# Patient Record
Sex: Male | Born: 1975 | Race: White | Hispanic: No | Smoking: Current every day smoker
Health system: Southern US, Community
[De-identification: ages and names within clinical notes are randomized; demographics above are authoritative.]

## PROBLEM LIST (undated history)

## (undated) DIAGNOSIS — F419 Anxiety disorder, unspecified: Secondary | ICD-10-CM

## (undated) DIAGNOSIS — I10 Essential (primary) hypertension: Secondary | ICD-10-CM

## (undated) DIAGNOSIS — K219 Gastro-esophageal reflux disease without esophagitis: Secondary | ICD-10-CM

## (undated) HISTORY — PX: MASTECTOMY: SHX3

## (undated) HISTORY — PX: HERNIA REPAIR: SHX51

## (undated) HISTORY — DX: Anxiety disorder, unspecified: F41.9

## (undated) HISTORY — DX: Gastro-esophageal reflux disease without esophagitis: K21.9

## (undated) HISTORY — DX: Essential (primary) hypertension: I10

---

## 2009-01-26 ENCOUNTER — Encounter: Admission: RE | Admit: 2009-01-26 | Discharge: 2009-01-26 | Payer: Self-pay | Admitting: Neurosurgery

## 2009-05-18 ENCOUNTER — Encounter: Admission: RE | Admit: 2009-05-18 | Discharge: 2009-05-18 | Payer: Self-pay | Admitting: Neurosurgery

## 2010-06-06 ENCOUNTER — Encounter: Payer: Self-pay | Admitting: Neurosurgery

## 2017-07-25 DIAGNOSIS — Z1389 Encounter for screening for other disorder: Secondary | ICD-10-CM | POA: Diagnosis not present

## 2017-07-25 DIAGNOSIS — M2550 Pain in unspecified joint: Secondary | ICD-10-CM | POA: Diagnosis not present

## 2017-07-25 DIAGNOSIS — R739 Hyperglycemia, unspecified: Secondary | ICD-10-CM | POA: Diagnosis not present

## 2017-07-25 DIAGNOSIS — I10 Essential (primary) hypertension: Secondary | ICD-10-CM | POA: Diagnosis not present

## 2017-07-25 DIAGNOSIS — I83813 Varicose veins of bilateral lower extremities with pain: Secondary | ICD-10-CM | POA: Diagnosis not present

## 2018-03-25 DIAGNOSIS — Z6839 Body mass index (BMI) 39.0-39.9, adult: Secondary | ICD-10-CM | POA: Diagnosis not present

## 2018-03-25 DIAGNOSIS — R739 Hyperglycemia, unspecified: Secondary | ICD-10-CM | POA: Diagnosis not present

## 2018-03-25 DIAGNOSIS — M79644 Pain in right finger(s): Secondary | ICD-10-CM | POA: Diagnosis not present

## 2018-03-25 DIAGNOSIS — Z23 Encounter for immunization: Secondary | ICD-10-CM | POA: Diagnosis not present

## 2018-03-25 DIAGNOSIS — I10 Essential (primary) hypertension: Secondary | ICD-10-CM | POA: Diagnosis not present

## 2018-10-25 ENCOUNTER — Other Ambulatory Visit: Payer: Self-pay

## 2018-10-25 DIAGNOSIS — Z20822 Contact with and (suspected) exposure to covid-19: Secondary | ICD-10-CM

## 2018-10-29 LAB — NOVEL CORONAVIRUS, NAA: SARS-CoV-2, NAA: NOT DETECTED

## 2019-01-31 DIAGNOSIS — Z6838 Body mass index (BMI) 38.0-38.9, adult: Secondary | ICD-10-CM | POA: Diagnosis not present

## 2019-01-31 DIAGNOSIS — M545 Low back pain: Secondary | ICD-10-CM | POA: Diagnosis not present

## 2019-03-04 ENCOUNTER — Other Ambulatory Visit: Payer: Self-pay

## 2019-03-04 DIAGNOSIS — Z20822 Contact with and (suspected) exposure to covid-19: Secondary | ICD-10-CM

## 2019-03-05 LAB — NOVEL CORONAVIRUS, NAA: SARS-CoV-2, NAA: NOT DETECTED

## 2019-03-07 ENCOUNTER — Telehealth: Payer: Self-pay

## 2019-03-07 NOTE — Telephone Encounter (Signed)
Patient called in and received his covid test result °

## 2019-04-28 DIAGNOSIS — Z6838 Body mass index (BMI) 38.0-38.9, adult: Secondary | ICD-10-CM | POA: Diagnosis not present

## 2019-04-28 DIAGNOSIS — R197 Diarrhea, unspecified: Secondary | ICD-10-CM | POA: Diagnosis not present

## 2019-04-28 DIAGNOSIS — K529 Noninfective gastroenteritis and colitis, unspecified: Secondary | ICD-10-CM | POA: Diagnosis not present

## 2019-11-25 ENCOUNTER — Ambulatory Visit: Payer: Medicaid Other

## 2019-11-25 ENCOUNTER — Other Ambulatory Visit: Payer: Self-pay

## 2019-12-01 ENCOUNTER — Ambulatory Visit: Payer: Medicaid Other

## 2020-01-22 ENCOUNTER — Ambulatory Visit: Payer: Medicaid Other | Admitting: Urology

## 2021-03-08 ENCOUNTER — Encounter (INDEPENDENT_AMBULATORY_CARE_PROVIDER_SITE_OTHER): Payer: Self-pay | Admitting: *Deleted

## 2021-07-14 ENCOUNTER — Ambulatory Visit (INDEPENDENT_AMBULATORY_CARE_PROVIDER_SITE_OTHER): Payer: Medicaid Other | Admitting: Gastroenterology

## 2021-07-14 ENCOUNTER — Encounter (INDEPENDENT_AMBULATORY_CARE_PROVIDER_SITE_OTHER): Payer: Self-pay | Admitting: Gastroenterology

## 2021-07-18 ENCOUNTER — Encounter (INDEPENDENT_AMBULATORY_CARE_PROVIDER_SITE_OTHER): Payer: Self-pay | Admitting: *Deleted

## 2021-09-12 ENCOUNTER — Encounter (INDEPENDENT_AMBULATORY_CARE_PROVIDER_SITE_OTHER): Payer: Self-pay | Admitting: *Deleted

## 2021-11-10 ENCOUNTER — Encounter (INDEPENDENT_AMBULATORY_CARE_PROVIDER_SITE_OTHER): Payer: Self-pay

## 2021-11-10 ENCOUNTER — Telehealth (INDEPENDENT_AMBULATORY_CARE_PROVIDER_SITE_OTHER): Payer: Self-pay

## 2021-11-10 ENCOUNTER — Encounter (INDEPENDENT_AMBULATORY_CARE_PROVIDER_SITE_OTHER): Payer: Self-pay | Admitting: Gastroenterology

## 2021-11-10 ENCOUNTER — Ambulatory Visit (INDEPENDENT_AMBULATORY_CARE_PROVIDER_SITE_OTHER): Payer: BC Managed Care – PPO | Admitting: Gastroenterology

## 2021-11-10 DIAGNOSIS — R1012 Left upper quadrant pain: Secondary | ICD-10-CM | POA: Diagnosis not present

## 2021-11-10 DIAGNOSIS — K219 Gastro-esophageal reflux disease without esophagitis: Secondary | ICD-10-CM | POA: Diagnosis not present

## 2021-11-10 MED ORDER — PEG 3350-KCL-NA BICARB-NACL 420 G PO SOLR: 4000.0000 mL | ORAL | 0 refills | Status: AC

## 2021-11-10 NOTE — Patient Instructions (Signed)
Schedule EGD and colonoscopy Continue pantoprazole 40 mg qday Patient encouraged to lose weight as this may provide benefit for her specific complaint but also due to other comorbidities Explained presumed etiology of reflux symptoms. Instruction provided in the use of antireflux medication - patient should take medication in the morning 30-45 minutes before eating breakfast. Discussed avoidance of eating within 2 hours of lying down to sleep and benefit of blocks to elevate head of bed. Also, will benefit from avoiding carbonated drinks/sodas or food that has tomatoes, spicy or greasy food.

## 2021-11-10 NOTE — Telephone Encounter (Signed)
Kynleigh Artz Ann Destine Ambroise, CMA  ?

## 2021-11-10 NOTE — Progress Notes (Signed)
Maylon Peppers, M.D. Gastroenterology & Hepatology [facility] [address] [place], [place] [address] Primary Care Physician: Denny Levy, [place] [address]  Referring MD: PCP  Chief Complaint: Left upper quadrant abdominal pain and GERD  History of Present Illness: Jeffrey Pitts is a 46 y.o. male with PM c. Diff colitis, GERD, anxiety, HTN, who presents for evaluation of left upper quadrant abdominal pain and GERD.  Patient reports that for the last 9 years he has presented recurrent episodes of LUQ constant pain, which exacerbates when eating roughage and greasy foods. He describes the pain as a spasm in his LUQ. It does not radiate anywhere else. He was put on pantoprazole 40 mg since his symptoms started for management of pain control, which he takes in the AM when he wakes up as he works third shift. He did not have heartburn episodes in the past, but he noticed recently that if he does not take it there will be presence of heartburn and regurgitation.  The patient denies having any nausea, vomiting, fever, chills, hematochezia, melena, hematemesis, abdominal distention, diarrhea, jaundice, pruritus.  Has tried changing his diet recently but is still not able to lose weight.  Has regular bowel movements.  Last NID:POEUM Last Colonoscopy:never  FHx: neg for any gastrointestinal/liver disease, no malignancies Social: smokes 1/2 pack a day, neg frequent alcohol , smokes marijuana occasionally Surgical: inguinal hernia  Past Medical History: Past Medical History:  Diagnosis Date   Anxiety    GERD (gastroesophageal reflux disease)    HTN (hypertension)     Past Surgical History: Past Surgical History:  Procedure Laterality Date   HERNIA REPAIR     MASTECTOMY Left     Family History: Family History  Problem Relation Age of Onset   Healthy Mother    Chronic Renal Failure Father     Social  History: Social History   Tobacco Use  Smoking Status Every Day   Packs/day: 0.50   Types: Cigarettes  Smokeless Tobacco Never   Social History   Substance and Sexual Activity  Alcohol Use Yes   Comment: 6-8 beers every other weekend.   Social History   Substance and Sexual Activity  Drug Use Yes   Types: Marijuana    Allergies: No Known Allergies  Medications: Current Outpatient Medications  Medication Sig Dispense Refill   pantoprazole (PROTONIX) 40 MG tablet Take 40 mg by mouth daily.     No current facility-administered medications for this visit.    Review of Systems: GENERAL: negative for malaise, night sweats HEENT: No changes in hearing or vision, no nose bleeds or other nasal problems. NECK: Negative for lumps, goiter, pain and significant neck swelling RESPIRATORY: Negative for cough, wheezing CARDIOVASCULAR: Negative for chest pain, leg swelling, palpitations, orthopnea GI: SEE HPI MUSCULOSKELETAL: Negative for joint pain or swelling, back pain, and muscle pain. SKIN: Negative for lesions, rash PSYCH: Negative for sleep disturbance, mood disorder and recent psychosocial stressors. HEMATOLOGY Negative for prolonged bleeding, bruising easily, and swollen nodes. ENDOCRINE: Negative for cold or heat intolerance, polyuria, polydipsia and goiter. NEURO: negative for tremor, gait imbalance, syncope and seizures. The remainder of the review of systems is noncontributory.   Physical Exam: BP (!) 156/117 (BP Location: Left Arm, Patient Position: Sitting, Cuff Size: Large)   Pulse (!) 109   Temp 98.1 F (36.7 C) (Oral)   Ht '5\' 10"'$  (1.778 m)   Wt 282 lb 9.6 oz (128.2 kg)  BMI 40.55 kg/m  GENERAL: The patient is AO x3, in no acute distress. HEENT: Head is normocephalic and atraumatic. EOMI are intact. Mouth is well hydrated and without lesions. NECK: Supple. No masses LUNGS: Clear to auscultation. No presence of rhonchi/wheezing/rales. Adequate chest  expansion HEART: RRR, normal s1 and s2. ABDOMEN: Soft, nontender, no guarding, no peritoneal signs, and nondistended. BS +. No masses. EXTREMITIES: Without any cyanosis, clubbing, rash, lesions or edema. NEUROLOGIC: AOx3, no focal motor deficit. SKIN: no jaundice, no rashes   Imaging/Labs: as above  I personally reviewed and interpreted the available labs, imaging and endoscopic files.  Impression and Plan: Jeffrey Pitts is a 46 y.o. male with PM c. Diff colitis, GERD, anxiety, HTN, who presents for evaluation of left upper quadrant abdominal pain and GERD.  The patient has presented a chronic history of left upper quadrant abdominal pain of unclear etiology.  He has not presented any associated symptoms and reports his pain is improved with the use of PPI although it is not completely gone.  At this moment, we will recommend scheduling an EGD to rule out organic etiologies given the persistence of pain despite taking PPI for more than 6 weeks.  It seems that he has presented improvement of his GERD while taking pantoprazole 40 mg every day.  He can continue taking this medication on a daily basis and if his symptoms are completely suppressed in a year, we can consider decreasing the dosage to 20 mg daily. The patient and I held a thorough discussion about potential nonpharmacologic treatments for reflux which include Transoral Incisionless Fundoplication (TIF).  The benefits and risks, as well as prognosis with the use of the different modalities was thoroughly discussed with the patient who understood and agreed.  The patient will read more about these procedures and will let me know if interested to pursue this in the future.  Finally, the patient is due for CRC screening, we will schedule a colonoscopy  - Schedule EGD and colonoscopy - Continue pantoprazole 40 mg qday - Patient encouraged to lose weight as this may provide benefit for her specific complaint but also due to other  comorbidities - Explained presumed etiology of reflux symptoms. Instruction provided in the use of antireflux medication - patient should take medication in the morning 30-45 minutes before eating breakfast. Discussed avoidance of eating within 2 hours of lying down to sleep and benefit of blocks to elevate head of bed. Also, will benefit from avoiding carbonated drinks/sodas or food that has tomatoes, spicy or greasy food.  All questions were answered.      Maylon Peppers, MD Gastroenterology and Hepatology [facility]

## 2021-12-23 ENCOUNTER — Ambulatory Visit (HOSPITAL_COMMUNITY): Payer: BC Managed Care – PPO | Admitting: Anesthesiology

## 2021-12-23 ENCOUNTER — Other Ambulatory Visit: Payer: Self-pay

## 2021-12-23 ENCOUNTER — Encounter (HOSPITAL_COMMUNITY): Payer: Self-pay | Admitting: Gastroenterology

## 2021-12-23 ENCOUNTER — Encounter (HOSPITAL_COMMUNITY)
Admission: RE | Disposition: A | Discharge status: Home / Self Care | Payer: Self-pay | Source: Home / Self Care | Attending: Gastroenterology

## 2021-12-23 ENCOUNTER — Ambulatory Visit (HOSPITAL_COMMUNITY)
Admission: RE | Admit: 2021-12-23 | Discharge: 2021-12-23 | Disposition: A | Discharge status: Home / Self Care | Payer: BC Managed Care – PPO | Attending: Gastroenterology | Admitting: Gastroenterology

## 2021-12-23 DIAGNOSIS — D122 Benign neoplasm of ascending colon: Secondary | ICD-10-CM | POA: Diagnosis not present

## 2021-12-23 DIAGNOSIS — F17210 Nicotine dependence, cigarettes, uncomplicated: Secondary | ICD-10-CM | POA: Insufficient documentation

## 2021-12-23 DIAGNOSIS — F419 Anxiety disorder, unspecified: Secondary | ICD-10-CM | POA: Diagnosis not present

## 2021-12-23 DIAGNOSIS — K648 Other hemorrhoids: Secondary | ICD-10-CM | POA: Insufficient documentation

## 2021-12-23 DIAGNOSIS — K31A19 Gastric intestinal metaplasia without dysplasia, unspecified site: Secondary | ICD-10-CM | POA: Insufficient documentation

## 2021-12-23 DIAGNOSIS — K319 Disease of stomach and duodenum, unspecified: Secondary | ICD-10-CM | POA: Insufficient documentation

## 2021-12-23 DIAGNOSIS — K5730 Diverticulosis of large intestine without perforation or abscess without bleeding: Secondary | ICD-10-CM | POA: Diagnosis not present

## 2021-12-23 DIAGNOSIS — D123 Benign neoplasm of transverse colon: Secondary | ICD-10-CM | POA: Diagnosis not present

## 2021-12-23 DIAGNOSIS — Z1211 Encounter for screening for malignant neoplasm of colon: Secondary | ICD-10-CM | POA: Insufficient documentation

## 2021-12-23 DIAGNOSIS — K219 Gastro-esophageal reflux disease without esophagitis: Secondary | ICD-10-CM | POA: Insufficient documentation

## 2021-12-23 DIAGNOSIS — R1012 Left upper quadrant pain: Secondary | ICD-10-CM | POA: Diagnosis not present

## 2021-12-23 DIAGNOSIS — K635 Polyp of colon: Secondary | ICD-10-CM | POA: Diagnosis not present

## 2021-12-23 HISTORY — PX: ESOPHAGOGASTRODUODENOSCOPY (EGD) WITH PROPOFOL: SHX5813

## 2021-12-23 HISTORY — PX: COLONOSCOPY WITH PROPOFOL: SHX5780

## 2021-12-23 HISTORY — PX: BIOPSY: SHX5522

## 2021-12-23 HISTORY — PX: POLYPECTOMY: SHX5525

## 2021-12-23 LAB — HM COLONOSCOPY

## 2021-12-23 SURGERY — COLONOSCOPY WITH PROPOFOL
Anesthesia: General

## 2021-12-23 MED ORDER — LACTATED RINGERS IV SOLN: INTRAVENOUS | Status: DC

## 2021-12-23 MED ORDER — STERILE WATER FOR IRRIGATION IR SOLN
Status: DC | PRN
  Administered 2021-12-23: .6 mL

## 2021-12-23 MED ORDER — PROPOFOL 10 MG/ML IV BOLUS
INTRAVENOUS | Status: DC | PRN
  Administered 2021-12-23 (×3): 50 mg via INTRAVENOUS
  Administered 2021-12-23: 100 mg via INTRAVENOUS
  Administered 2021-12-23 (×2): 50 mg via INTRAVENOUS
  Administered 2021-12-23: 100 mg via INTRAVENOUS
  Administered 2021-12-23 (×2): 50 mg via INTRAVENOUS

## 2021-12-23 NOTE — H&P (Signed)
Jeffrey Pitts is an 46 y.o. male.   Chief Complaint: abdominal pain, GERD and CRC screening HPI: Jeffrey Pitts is a 46 y.o. male with PM c. Diff colitis, GERD, anxiety, HTN, who presents for colorectal cancer screening, left upper quadrant abdominal pain and GERD.    Patient reported as long as he takes pantoprazole 40 mg every day he does not have any episodes of GERD.  However, he still presenting left upper quadrant abdominal pain intermittently which does not improve with the medication. The patient has never had a colonoscopy in the past.  The patient denies having any complaints such as melena, hematochezia, abdominal distention, change in her bowel movement consistency or frequency, no changes in weight recently.  No family history of colorectal cancer.   Past Medical History:  Diagnosis Date   Anxiety    GERD (gastroesophageal reflux disease)    HTN (hypertension)     Past Surgical History:  Procedure Laterality Date   HERNIA REPAIR     MASTECTOMY Left     Family History  Problem Relation Age of Onset   Healthy Mother    Chronic Renal Failure Father    Social History:  reports that he has been smoking cigarettes. He has been smoking an average of .5 packs per day. He has never used smokeless tobacco. He reports current alcohol use. He reports current drug use. Drug: Marijuana.  Allergies: No Known Allergies  Medications Prior to Admission  Medication Sig Dispense Refill   Multiple Vitamin (MULTIVITAMIN WITH MINERALS) TABS tablet Take 1 tablet by mouth once a week.     pantoprazole (PROTONIX) 40 MG tablet Take 40 mg by mouth in the morning.     polyethylene glycol-electrolytes (TRILYTE) 420 g solution Take 4,000 mLs by mouth as directed. 4000 mL 0   Ascorbic Acid (VITAMIN C PO) Take 1 tablet by mouth once a week.     busPIRone (BUSPAR) 15 MG tablet Take 15 mg by mouth daily.      No results found for this or any previous visit (from the past 48 hour(s)). No results  found.  Review of Systems  Constitutional: Negative.   HENT: Negative.    Eyes: Negative.   Respiratory: Negative.    Cardiovascular: Negative.   Gastrointestinal:  Positive for abdominal pain.  Endocrine: Negative.   Genitourinary: Negative.   Musculoskeletal: Negative.   Skin: Negative.   Allergic/Immunologic: Negative.   Neurological: Negative.   Hematological: Negative.   Psychiatric/Behavioral: Negative.      Blood pressure (!) 177/104, pulse 90, temperature 98 F (36.7 C), temperature source Oral, resp. rate 16, height '5\' 10"'$  (1.778 m), weight 127.9 kg, SpO2 98 %. Physical Exam  GENERAL: The patient is AO x3, in no acute distress. HEENT: Head is normocephalic and atraumatic. EOMI are intact. Mouth is well hydrated and without lesions. NECK: Supple. No masses LUNGS: Clear to auscultation. No presence of rhonchi/wheezing/rales. Adequate chest expansion HEART: RRR, normal s1 and s2. ABDOMEN: Soft, nontender, no guarding, no peritoneal signs, and nondistended. BS +. No masses. EXTREMITIES: Without any cyanosis, clubbing, rash, lesions or edema. NEUROLOGIC: AOx3, no focal motor deficit. SKIN: no jaundice, no rashes  Assessment/Plan Jeffrey Pitts is a 46 y.o. male with PM c. Diff colitis, GERD, anxiety, HTN, who presents for colorectal cancer screening, left upper quadrant abdominal pain and GERD.  Will proceed with EGD and colonoscopy.  Harvel Quale, MD 12/23/2021, 8:27 AM

## 2021-12-23 NOTE — Op Note (Signed)
[facility] Patient Name: Jeffrey Pitts Procedure Date: 12/23/2021 9:36 AM MRN: [id number] Date of Birth: 05-Nov-1975 Attending MD: Maylon Peppers ,  CSN: [id number] Age: 46 Admit Type: Outpatient Procedure:                Colonoscopy Indications:              Screening for colorectal malignant neoplasm Providers:                Maylon Peppers, Janeece Riggers, RN, Rosina Lowenstein, RN Referring MD:             Maylon Peppers Medicines:                Monitored Anesthesia Care Complications:            No immediate complications. Estimated Blood Loss:     Estimated blood loss: none. Procedure:                Pre-Anesthesia Assessment:                           - Prior to the procedure, a History and Physical                            was performed, and patient medications, allergies                            and sensitivities were reviewed. The patient's                            tolerance of previous anesthesia was reviewed.                           - The risks and benefits of the procedure and the                            sedation options and risks were discussed with the                            patient. All questions were answered and informed                            consent was obtained.                           - ASA Grade Assessment: II - A patient with mild                            systemic disease.                           After obtaining informed consent, the colonoscope                            was passed under direct vision. Throughout the                            procedure, the patient's  blood pressure, pulse, and                            oxygen saturations were monitored continuously. The                            [id number]-HQ190L ([id number]) scope was introduced through                            the anus and advanced to the the cecum, identified                            by appendiceal orifice and ileocecal valve. The                             colonoscopy was performed without difficulty. The                            patient tolerated the procedure well. The quality                            of the bowel preparation was good. Scope In: 4:16:60 AM Scope Out: 10:17:33 AM Scope Withdrawal Time: 0 hours 16 minutes 8 seconds  Total Procedure Duration: 0 hours 21 minutes 19 seconds  Findings:      The perianal and digital rectal examinations were normal.      Two sessile polyps were found in the transverse colon and ascending       colon. The polyps were 4 to 8 mm in size. These polyps were removed with       a cold snare. Resection and retrieval were complete.      A 8 mm polyp was found in the sigmoid colon. The polyp was sessile. The       polyp was removed with a cold snare. Resection and retrieval were       complete.      A few small and large-mouthed diverticula were found in the sigmoid       colon and ascending colon.      Non-bleeding internal hemorrhoids were found during retroflexion. The       hemorrhoids were small. Impression:               - Two 4 to 8 mm polyps in the transverse colon and                            in the ascending colon, removed with a cold snare.                            Resected and retrieved.                           - One 8 mm polyp in the sigmoid colon, removed with                            a cold snare. Resected and retrieved.                           -  Diverticulosis in the sigmoid colon and in the                            ascending colon.                           - Non-bleeding internal hemorrhoids. Moderate Sedation:      Per Anesthesia Care Recommendation:           - Discharge patient to home (ambulatory).                           - Resume previous diet.                           - Await pathology results.                           - Repeat colonoscopy for surveillance based on                            pathology results. Procedure Code(s):        --- Professional  ---                           [phone], Colonoscopy, flexible; with removal of                            tumor(s), polyp(s), or other lesion(s) by snare                            technique Diagnosis Code(s):        --- Professional ---                           K63.5, Polyp of colon                           Z12.11, Encounter for screening for malignant                            neoplasm of colon                           K64.8, Other hemorrhoids                           K57.30, Diverticulosis of large intestine without                            perforation or abscess without bleeding CPT copyright 2019 American Medical Association. All rights reserved. The codes documented in this report are preliminary and upon coder review may  be revised to meet current compliance requirements. Maylon Peppers, MD Maylon Peppers,  12/23/2021 10:24:00 AM This report has been signed electronically. Number of Addenda: 0

## 2021-12-23 NOTE — Anesthesia Preprocedure Evaluation (Addendum)
Anesthesia Evaluation  Patient identified by MRN, date of birth, ID band Patient awake    Reviewed: Allergy & Precautions, NPO status , Patient's Chart, lab work & pertinent test results  Airway Mallampati: II  TM Distance: >3 FB Neck ROM: Full    Dental  (+) Dental Advisory Given, Missing, Chipped,    Pulmonary neg sleep apnea (snoring), Current Smoker and Patient abstained from smoking.,    Pulmonary exam normal breath sounds clear to auscultation       Cardiovascular Exercise Tolerance: Good hypertension, Normal cardiovascular exam Rhythm:Regular Rate:Normal     Neuro/Psych PSYCHIATRIC DISORDERS Anxiety negative neurological ROS     GI/Hepatic GERD  Medicated,(+)     substance abuse  marijuana use,   Endo/Other  negative endocrine ROS  Renal/GU negative Renal ROS  negative genitourinary   Musculoskeletal negative musculoskeletal ROS (+)   Abdominal   Peds negative pediatric ROS (+)  Hematology negative hematology ROS (+)   Anesthesia Other Findings Back pain  Reproductive/Obstetrics negative OB ROS                           Anesthesia Physical Anesthesia Plan  ASA: 3  Anesthesia Plan: General   Post-op Pain Management: Minimal or no pain anticipated   Induction: Intravenous  PONV Risk Score and Plan: Propofol infusion  Airway Management Planned: Nasal Cannula and Natural Airway  Additional Equipment:   Intra-op Plan:   Post-operative Plan:   Informed Consent: I have reviewed the patients History and Physical, chart, labs and discussed the procedure including the risks, benefits and alternatives for the proposed anesthesia with the patient or authorized representative who has indicated his/her understanding and acceptance.     Dental advisory given  Plan Discussed with: CRNA and Surgeon  Anesthesia Plan Comments:       Anesthesia Quick Evaluation

## 2021-12-23 NOTE — Anesthesia Postprocedure Evaluation (Signed)
Anesthesia Post Note  Patient: Jeffrey Pitts  Procedure(s) Performed: COLONOSCOPY WITH PROPOFOL ESOPHAGOGASTRODUODENOSCOPY (EGD) WITH PROPOFOL BIOPSY POLYPECTOMY  Patient location during evaluation: Endoscopy Anesthesia Type: General Level of consciousness: awake and alert Pain management: pain level controlled Vital Signs Assessment: post-procedure vital signs reviewed and stable Respiratory status: spontaneous breathing Cardiovascular status: blood pressure returned to baseline and stable Postop Assessment: no apparent nausea or vomiting Anesthetic complications: no   No notable events documented.   Last Vitals:  Vitals:   12/23/21 0822 12/23/21 1022  BP: (!) 177/104 123/80  Pulse:  92  Resp:  18  Temp:  36.4 C  SpO2:  100%    Last Pain:  Vitals:   12/23/21 1022  TempSrc: Oral  PainSc: 0-No pain                 Sharee Pimple

## 2021-12-23 NOTE — Op Note (Addendum)
[facility] Patient Name: Jeffrey Pitts Procedure Date: 12/23/2021 9:37 AM MRN: [id number] Date of Birth: Sep 11, 1975 Attending MD: Maylon Peppers ,  CSN: [id number] Age: 46 Admit Type: Outpatient Procedure:                Upper GI endoscopy Indications:              Abdominal pain in the left upper quadrant,                            Follow-up of gastro-esophageal reflux disease Providers:                Maylon Peppers, Janeece Riggers, RN, Rosina Lowenstein, RN Referring MD:             Maylon Peppers Medicines:                Monitored Anesthesia Care Complications:            No immediate complications. Estimated Blood Loss:     Estimated blood loss: none. Procedure:                Pre-Anesthesia Assessment:                           - Prior to the procedure, a History and Physical                            was performed, and patient medications, allergies                            and sensitivities were reviewed. The patient's                            tolerance of previous anesthesia was reviewed.                           - The risks and benefits of the procedure and the                            sedation options and risks were discussed with the                            patient. All questions were answered and informed                            consent was obtained.                           - ASA Grade Assessment: II - A patient with mild                            systemic disease.                           After obtaining informed consent, the endoscope was  passed under direct vision. Throughout the                            procedure, the patient's blood pressure, pulse, and                            oxygen saturations were monitored continuously. The                            [id number]-[id number] ([id number]) scope was introduced through the                            mouth, and advanced to the second part of duodenum.                            The  upper GI endoscopy was accomplished without                            difficulty. The patient tolerated the procedure                            well. Scope In: 9:45:44 AM Scope Out: 9:50:25 AM Total Procedure Duration: 0 hours 4 minutes 41 seconds  Findings:      The examined esophagus was normal.      The Z-line was regular and was found 40 cm from the incisors.      The gastroesophageal flap valve was visualized endoscopically and       classified as Hill Grade III (minimal fold, loose to endoscope, hiatal       hernia likely).      The entire examined stomach was normal. Biopsies were taken with a cold       forceps for Helicobacter pylori testing.      The examined duodenum was normal.      Note: pain possibly related to functional dyspepsia Impression:               - Normal esophagus.                           - Z-line regular, 40 cm from the incisors.                           - Gastroesophageal flap valve classified as Hill                            Grade III (minimal fold, loose to endoscope, hiatal                            hernia likely).                           - Normal stomach. Biopsied.                           - Normal examined duodenum. Moderate Sedation:      Per Anesthesia Care Recommendation:           -  Discharge patient to home (ambulatory).                           - Resume previous diet.                           - Continue present medications.                           - Await pathology results.                           - Check H. pylori serology Procedure Code(s):        --- Professional ---                           [phone], Esophagogastroduodenoscopy, flexible,                            transoral; with biopsy, single or multiple Diagnosis Code(s):        --- Professional ---                           R10.12, Left upper quadrant pain                           K21.9, Gastro-esophageal reflux disease without                             esophagitis CPT copyright 2019 American Medical Association. All rights reserved. The codes documented in this report are preliminary and upon coder review may  be revised to meet current compliance requirements. Maylon Peppers, MD Maylon Peppers,  12/23/2021 9:55:04 AM This report has been signed electronically. Number of Addenda: 0

## 2021-12-23 NOTE — Transfer of Care (Signed)
Immediate Anesthesia Transfer of Care Note  Patient: Jeffrey Pitts  Procedure(s) Performed: COLONOSCOPY WITH PROPOFOL ESOPHAGOGASTRODUODENOSCOPY (EGD) WITH PROPOFOL BIOPSY POLYPECTOMY  Patient Location: Endoscopy Unit  Anesthesia Type:General  Level of Consciousness: awake  Airway & Oxygen Therapy: Patient Spontanous Breathing  Post-op Assessment: Report given to RN  Post vital signs: Reviewed and stable  Last Vitals:  Vitals Value Taken Time  BP                      123/80    Temp 36.4 C 12/23/21 1022  Pulse 92 12/23/21 1022  Resp 18 12/23/21 1022  SpO2 100 % 12/23/21 1022    Last Pain:  Vitals:   12/23/21 1022  TempSrc: Oral  PainSc: 0-No pain      Patients Stated Pain Goal: 8 (81/85/90 9311)  Complications: No notable events documented.

## 2021-12-23 NOTE — Discharge Instructions (Addendum)
You are being discharged to home.  Resume your previous diet.  Continue your present medications.  We are waiting for your pathology results.  Your physician has recommended a repeat colonoscopy for surveillance based on pathology results.

## 2021-12-23 NOTE — Progress Notes (Signed)
Please excuse Jeffrey Pitts from work today August 11,2023 until Saturday December 24, 2021 after 10:33am.  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

## 2021-12-26 ENCOUNTER — Encounter (INDEPENDENT_AMBULATORY_CARE_PROVIDER_SITE_OTHER): Payer: Self-pay | Admitting: *Deleted

## 2021-12-26 LAB — SURGICAL PATHOLOGY

## 2021-12-26 LAB — H. PYLORI ANTIBODY, IGG: H Pylori IgG: 0.18 Index Value (ref 0.00–0.79)

## 2021-12-27 ENCOUNTER — Encounter (HOSPITAL_COMMUNITY): Payer: Self-pay | Admitting: Gastroenterology

## 2022-01-17 NOTE — Progress Notes (Signed)
H. Pylori performed to rule out H. Pylori infection causing patient's current complaints

## 2022-10-25 ENCOUNTER — Encounter (INDEPENDENT_AMBULATORY_CARE_PROVIDER_SITE_OTHER): Payer: Self-pay | Admitting: Gastroenterology

## 2022-12-11 ENCOUNTER — Ambulatory Visit (INDEPENDENT_AMBULATORY_CARE_PROVIDER_SITE_OTHER): Payer: BC Managed Care – PPO | Admitting: Gastroenterology

## 2023-01-01 ENCOUNTER — Ambulatory Visit (INDEPENDENT_AMBULATORY_CARE_PROVIDER_SITE_OTHER): Payer: BC Managed Care – PPO | Admitting: Gastroenterology
